# Patient Record
Sex: Male | Born: 1985 | Race: White | Hispanic: No | Marital: Single | Smoking: Current every day smoker
Health system: Southern US, Community
[De-identification: ages and names within clinical notes are randomized; demographics above are authoritative.]

---

## 2005-08-05 ENCOUNTER — Emergency Department: Payer: Self-pay | Admitting: Emergency Medicine

## 2007-12-16 ENCOUNTER — Emergency Department: Payer: Self-pay | Admitting: Emergency Medicine

## 2008-10-23 ENCOUNTER — Inpatient Hospital Stay: Payer: Self-pay | Admitting: Psychiatry

## 2009-12-03 ENCOUNTER — Emergency Department: Payer: Self-pay | Admitting: Unknown Physician Specialty

## 2010-02-12 ENCOUNTER — Emergency Department: Payer: Self-pay | Admitting: Emergency Medicine

## 2010-04-21 IMAGING — CT CT HEAD WITHOUT CONTRAST
1 of 2 series · 15 of 30 positions shown, 19 images · non-contrast
Comparison: none

REASON FOR EXAM: altered mental status - found by police significantly
impaired
COMMENTS:

PROCEDURE:     CT  - CT HEAD WITHOUT CONTRAST  - October 22, 2008  [DATE]
RESULT:     Comparison:  None
TECHNIQUE: Multiple axial images from the foramen magnum to the vertex were
obtained without IV contrast.

[Series 7: soft tissue · axial · 0.46mm/px · z∈[-90,-15]mm · 15 of 19 slices shown, 19 images]
[im 2/19  brain]
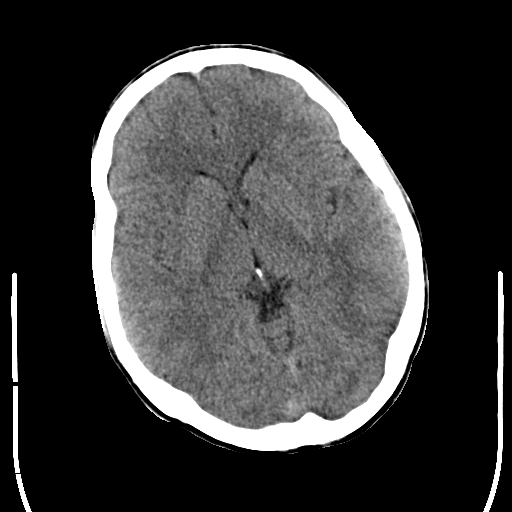
[im 2/19  bone]
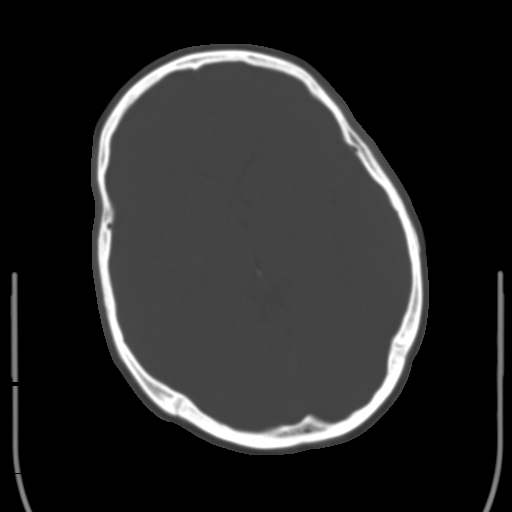
[im 3/19  brain]
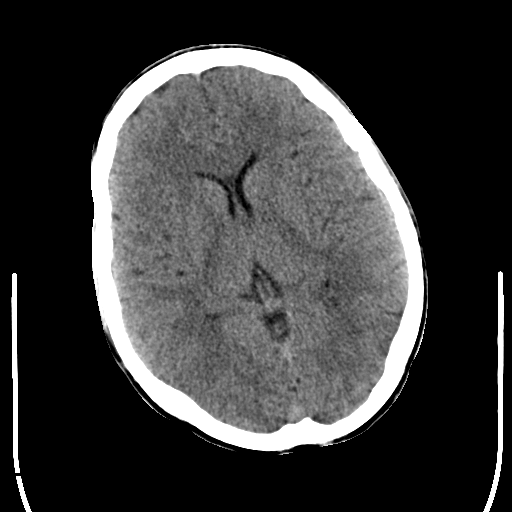
[im 4/19  brain]
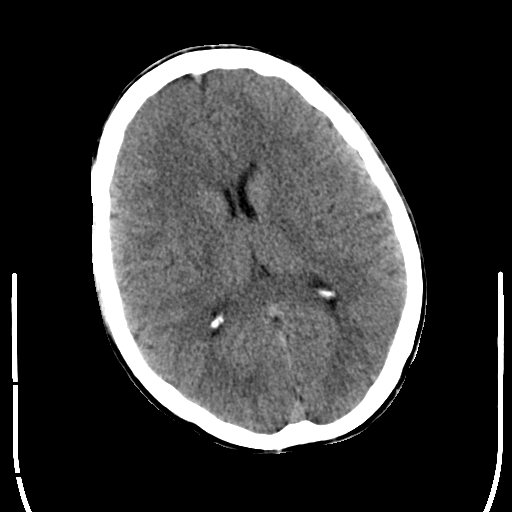
[im 5/19  brain]
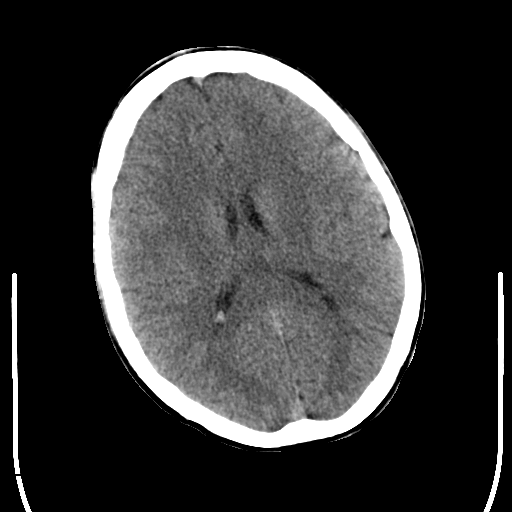
[im 6/19  brain]
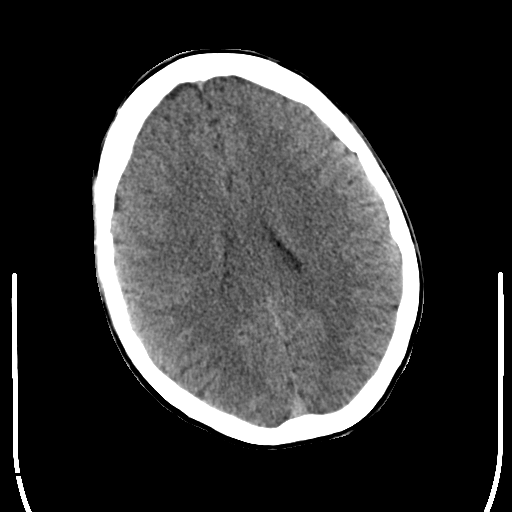
[im 6/19  bone]
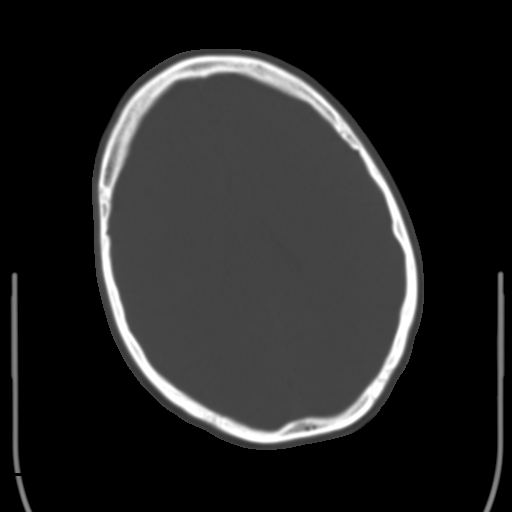
[im 7/19  brain]
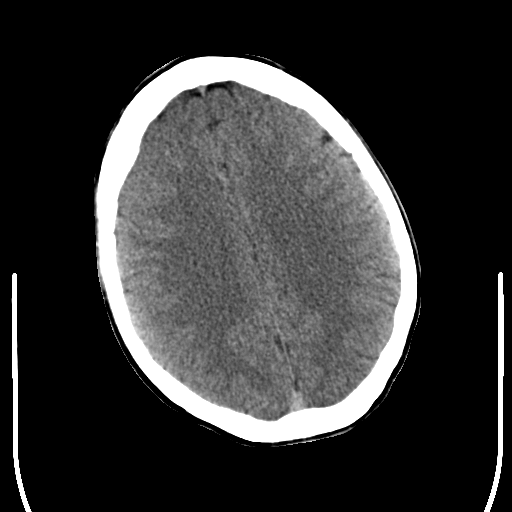
[im 8/19  brain]
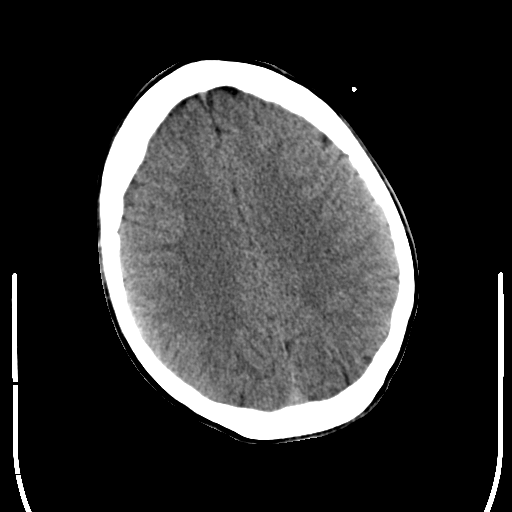
[im 10/19  brain]
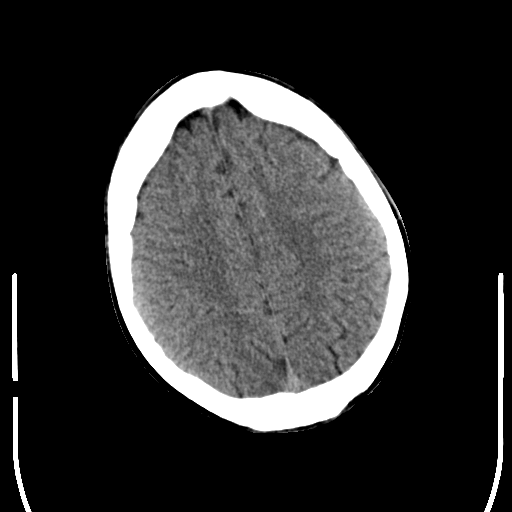
[im 11/19  brain]
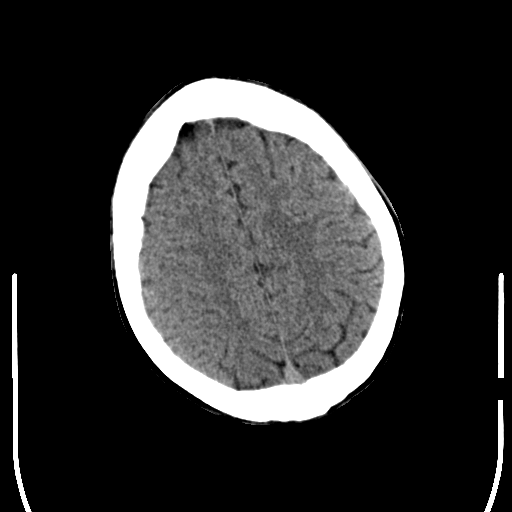
[im 11/19  bone]
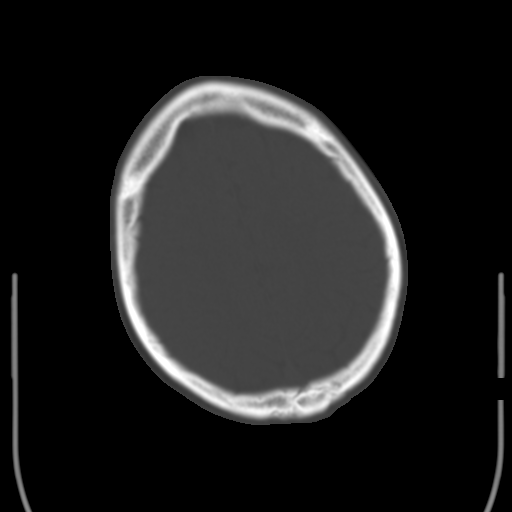
[im 12/19  brain]
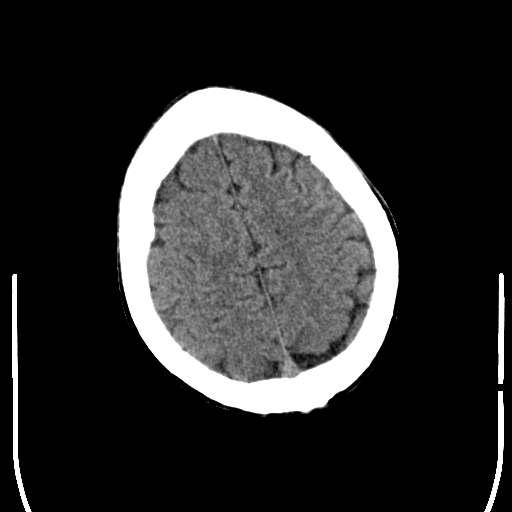
[im 13/19  brain]
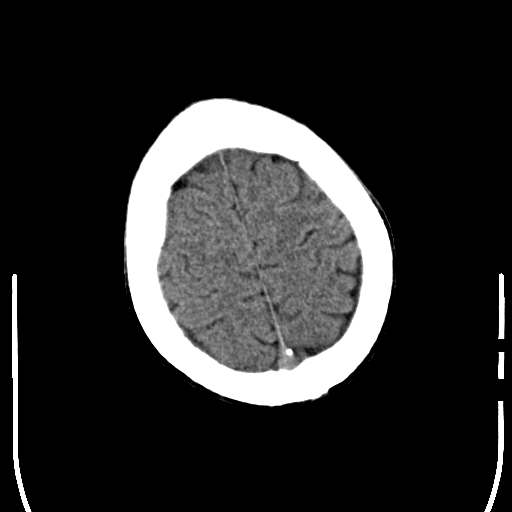
[im 14/19  brain]
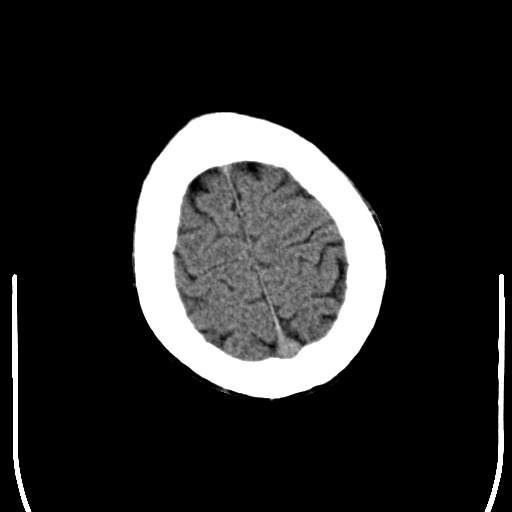
[im 15/19  brain]
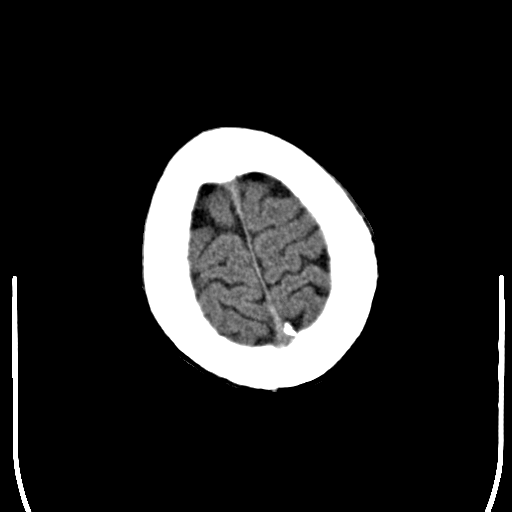
[im 15/19  bone]
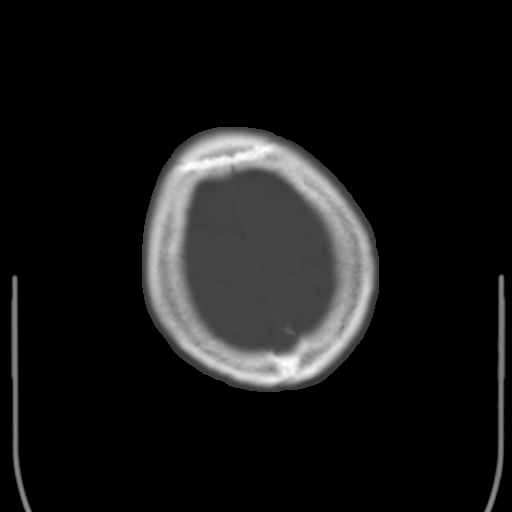
[im 16/19  brain]
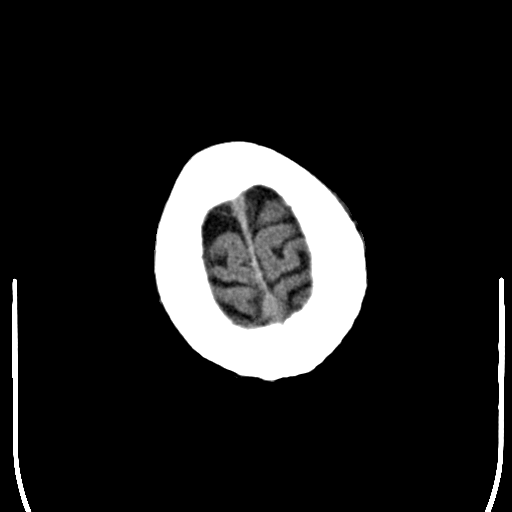
[im 17/19  brain]
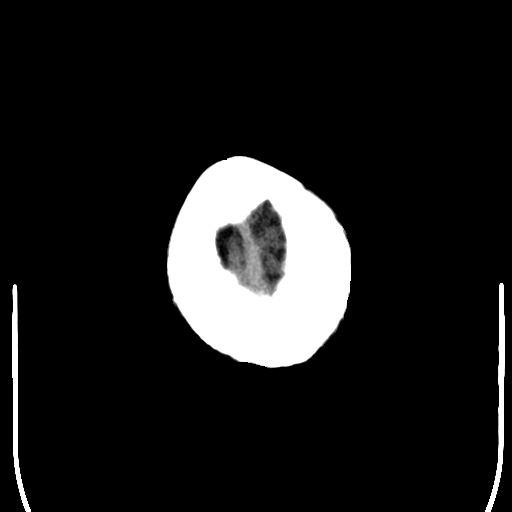

[15 of 30 positions shown; findings below may reference images not displayed]

FINDINGS: There is no evidence of mass effect, midline shift, or extra-axial fluid
collections.  There is no evidence of a space-occupying lesion or
intracranial hemorrhage. There is no evidence of a cortical-based area of
acute infarction.

The ventricles and sulci are appropriate for the patient's age. The basal
cisterns are patent.

Visualized portions of the orbits are unremarkable. The visualized portions
of the paranasal sinuses and mastoid air cells are unremarkable.

The osseous structures are unremarkable.
IMPRESSION: No acute intracranial process.

## 2010-05-31 ENCOUNTER — Emergency Department: Payer: Self-pay | Admitting: Emergency Medicine

## 2010-11-24 ENCOUNTER — Emergency Department: Payer: Self-pay | Admitting: Emergency Medicine

## 2011-06-02 IMAGING — CR RIGHT ANKLE - 2 VIEW
1 series · 2 of 2 positions shown · non-contrast
Comparison: none

REASON FOR EXAM: pain swelling
COMMENTS:

PROCEDURE:     DXR - DXR ANKLE RIGHT AP AND LATERAL  - December 03, 2009 [DATE]
RESULT:     No fracture, dislocation or other acute bony abnormality is
identified. The ankle mortise is well-maintained.

[Series 1: view not recorded · 0.17mm/px · 2 of 2 slices shown]
[im 1/2]
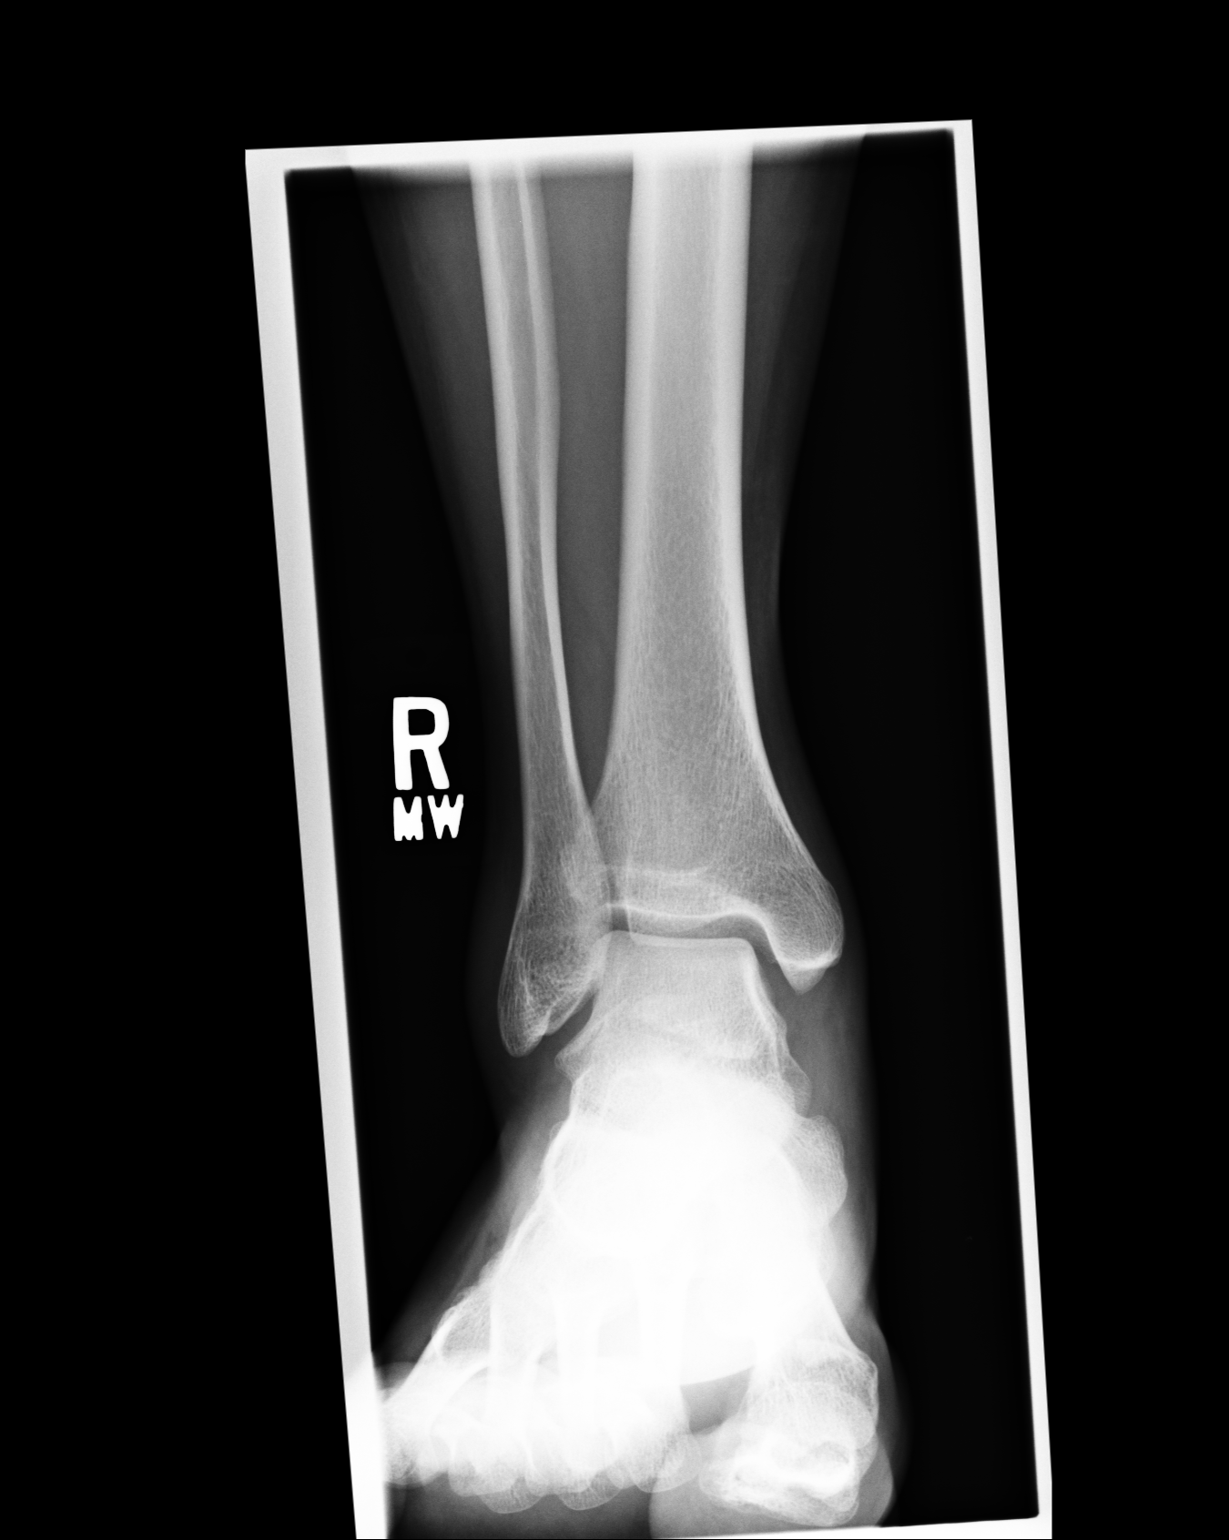
[im 2/2]
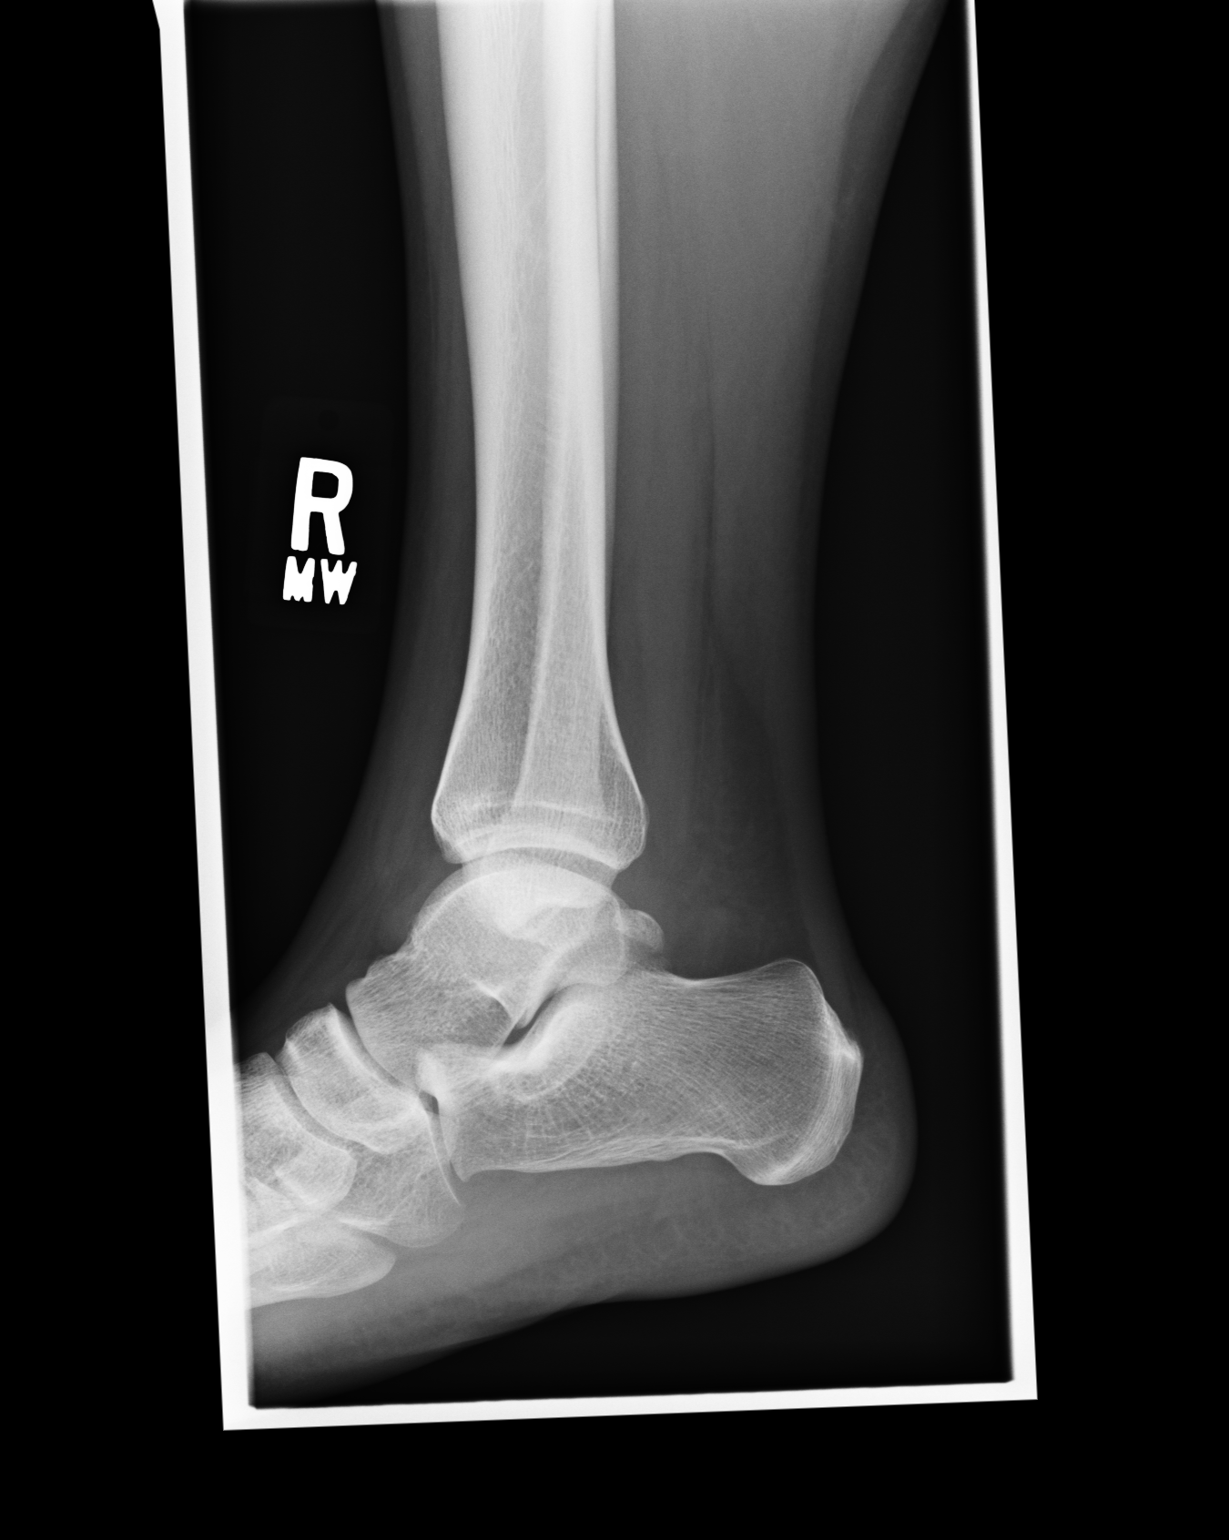

[2 of 2 positions shown; findings below may reference images not displayed]

IMPRESSION: 1.     No acute changes are identified.

## 2013-06-13 ENCOUNTER — Emergency Department: Payer: Self-pay | Admitting: Emergency Medicine

## 2013-06-13 LAB — COMPREHENSIVE METABOLIC PANEL
AST: 16 U/L (ref 15–37)
Albumin: 3.8 g/dL (ref 3.4–5.0)
Alkaline Phosphatase: 85 U/L
Anion Gap: 9 (ref 7–16)
BILIRUBIN TOTAL: 0.7 mg/dL (ref 0.2–1.0)
BUN: 11 mg/dL (ref 7–18)
Calcium, Total: 9 mg/dL (ref 8.5–10.1)
Chloride: 102 mmol/L (ref 98–107)
Co2: 26 mmol/L (ref 21–32)
Creatinine: 0.94 mg/dL (ref 0.60–1.30)
EGFR (African American): >60
EGFR (Non-African Amer.): >60
Glucose: 89 mg/dL (ref 65–99)
OSMOLALITY: 273 (ref 275–301)
Potassium: 3.3 mmol/L — ABNORMAL LOW (ref 3.5–5.1)
SGPT (ALT): 14 U/L (ref 12–78)
Sodium: 137 mmol/L (ref 136–145)
TOTAL PROTEIN: 7.4 g/dL (ref 6.4–8.2)

## 2013-06-13 LAB — CBC WITH DIFFERENTIAL/PLATELET
BASOS ABS: 0.1 10*3/uL (ref 0.0–0.1)
Basophil %: 0.6 %
Eosinophil #: 0.1 10*3/uL (ref 0.0–0.7)
Eosinophil %: 0.4 %
HCT: 42.6 % (ref 40.0–52.0)
HGB: 14.2 g/dL (ref 13.0–18.0)
Lymphocyte #: 1.8 10*3/uL (ref 1.0–3.6)
Lymphocyte %: 12.9 %
MCH: 30.3 pg (ref 26.0–34.0)
MCHC: 33.3 g/dL (ref 32.0–36.0)
MCV: 91 fL (ref 80–100)
Monocyte #: 0.9 x10 3/mm (ref 0.2–1.0)
Monocyte %: 6.6 %
NEUTROS ABS: 10.9 10*3/uL — AB (ref 1.4–6.5)
Neutrophil %: 79.5 %
PLATELETS: 244 10*3/uL (ref 150–440)
RBC: 4.68 10*6/uL (ref 4.40–5.90)
RDW: 12.9 % (ref 11.5–14.5)
WBC: 13.7 10*3/uL — ABNORMAL HIGH (ref 3.8–10.6)

## 2013-06-13 LAB — CK: CK, Total: 106 U/L

## 2013-06-15 LAB — BETA STREP CULTURE(ARMC)

## 2014-12-11 IMAGING — CR DG CHEST 2V
1 series · 2 of 2 positions shown · non-contrast
Comparison: 11/25/2010

CLINICAL DATA: Body aches.  Shortness of breath.

EXAM:
CHEST  2 VIEW

[Series 1: w chest pa · 0.14mm/px · 2 of 2 slices shown]
[im 1/2]
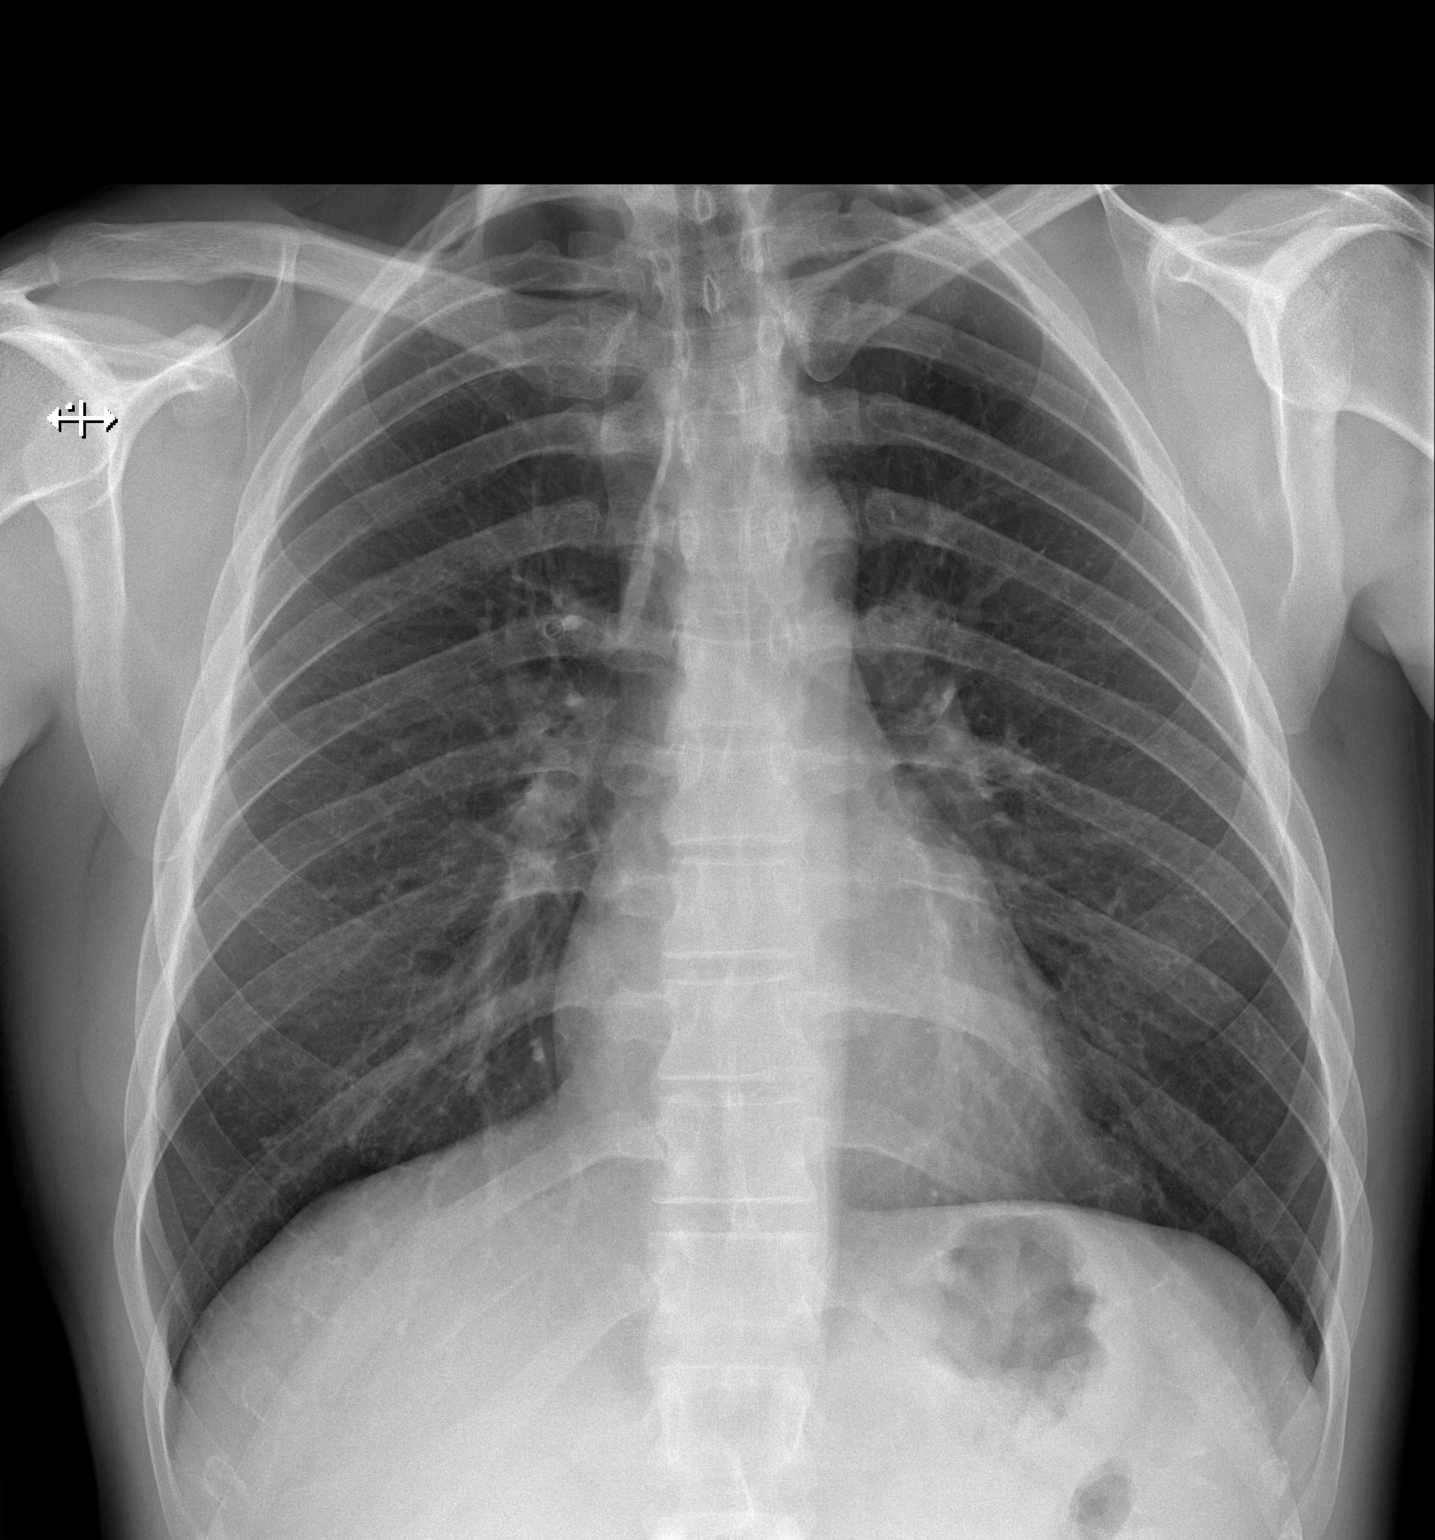
[im 2/2]
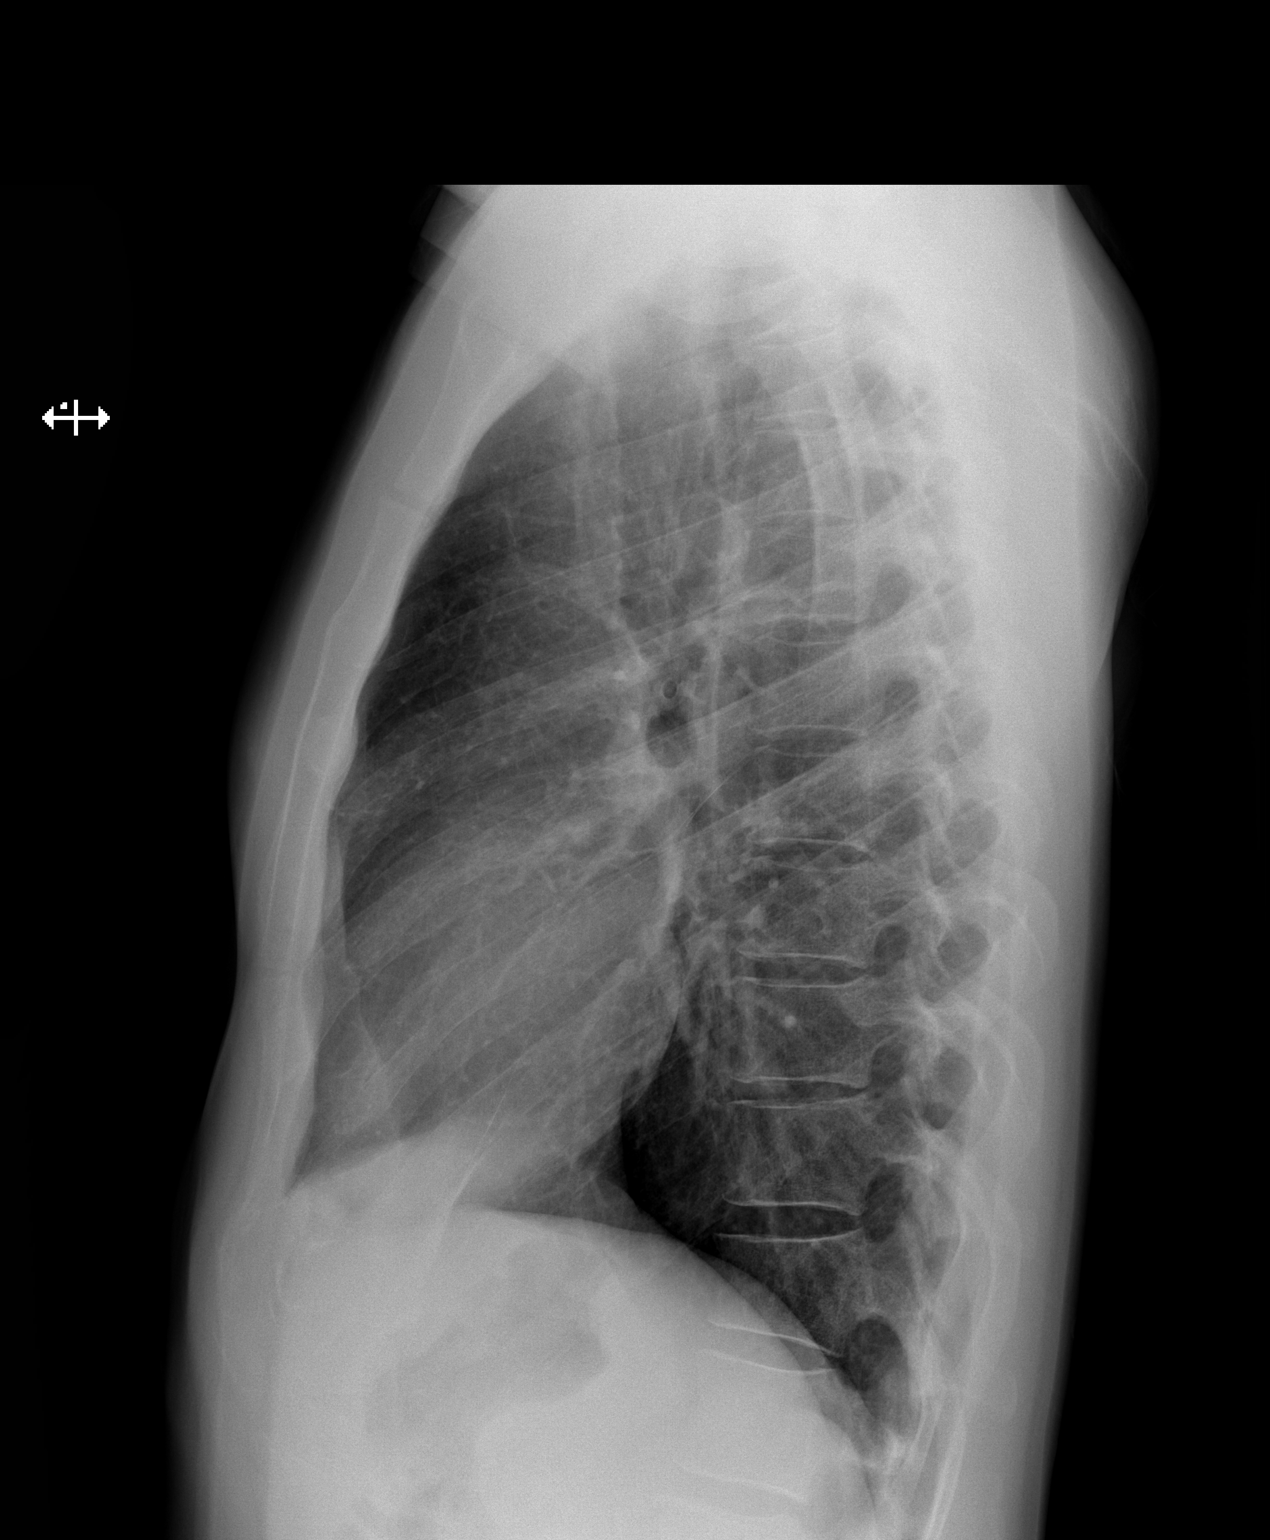

[2 of 2 positions shown; findings below may reference images not displayed]

FINDINGS: The heart size and mediastinal contours are within normal limits.
Both lungs are clear. The visualized skeletal structures are
unremarkable.
IMPRESSION: No active cardiopulmonary disease.

## 2015-10-19 ENCOUNTER — Emergency Department: Payer: Self-pay

## 2015-10-19 ENCOUNTER — Emergency Department
Admission: EM | Admit: 2015-10-19 | Discharge: 2015-10-19 | Disposition: A | Discharge status: Home / Self Care | Payer: Self-pay | Attending: Emergency Medicine | Admitting: Emergency Medicine

## 2015-10-19 DIAGNOSIS — M25511 Pain in right shoulder: Secondary | ICD-10-CM

## 2015-10-19 DIAGNOSIS — F17210 Nicotine dependence, cigarettes, uncomplicated: Secondary | ICD-10-CM | POA: Insufficient documentation

## 2015-10-19 DIAGNOSIS — M25512 Pain in left shoulder: Secondary | ICD-10-CM | POA: Insufficient documentation

## 2015-10-19 MED ORDER — TRAMADOL HCL 50 MG PO TABS: 50.0000 mg | ORAL_TABLET | Freq: Four times a day (QID) | ORAL | 0 refills | Status: AC | PRN

## 2015-10-19 MED ORDER — NAPROXEN 500 MG PO TABS: 500.0000 mg | ORAL_TABLET | Freq: Two times a day (BID) | ORAL | Status: AC

## 2015-10-19 NOTE — ED Notes (Signed)
States he developed pain to left shoulder about 3 days ago   Unsure of injury but was wrestling with friend  Did not feel a "pop"   No deformity noted

## 2015-10-19 NOTE — ED Triage Notes (Signed)
Pt c/o waking 2-3 days ago and had left shoulder pain, denies injury.

## 2015-10-19 NOTE — ED Provider Notes (Signed)
[facility] Emergency Department Provider Note   ____________________________________________   None    (approximate)  I have reviewed the triage vital signs and the nursing notes.   HISTORY  Chief Complaint Shoulder Pain    HPI Tanner Weeks is a 30 y.o. male patient complaining left shoulder pain secondary to wrestling incident 3 days ago. Patient was wrestling with friend. Patient did not notice any acute incident while wrestling. Patient's [facility] Patient state pain and discomfort increases with adduction and overhead reaching. Patient rates his pain as 8/10. Patient described a pain as "achy". No palliative measures for this complaint.   History reviewed. No pertinent past medical history.  There are no active problems to display for this patient.   History reviewed. No pertinent surgical history.  Prior to Admission medications   Medication Sig Start Date End Date Taking? Authorizing Provider  naproxen (NAPROSYN) 500 MG tablet Take 1 tablet (500 mg total) by mouth 2 (two) times daily with a meal. 10/19/15   Joni Reiningonald K Dorleen Kissel, PA-C  traMADol (ULTRAM) 50 MG tablet Take 1 tablet (50 mg total) by mouth every 6 (six) hours as needed for moderate pain. 10/19/15   Joni Reiningonald K Jamicheal Heard, PA-C    Allergies Review of patient's allergies indicates no known allergies.  No family history on file.  Social History Social History  Substance Use Topics  . Smoking status: Current Every Day Smoker    Types: Cigarettes  . Smokeless tobacco: Never Used  . Alcohol use No    Review of Systems Constitutional: No fever/chills Eyes: No visual changes. ENT: No sore throat. Cardiovascular: Denies chest pain. Respiratory: Denies shortness of breath. Gastrointestinal: No abdominal pain.  No nausea, no vomiting.  No diarrhea.  No constipation. Genitourinary: Negative for dysuria. Musculoskeletal:Left shoulder pain  Skin:  Negative for rash. Neurological: Negative for headaches, focal weakness or numbness.   ____________________________________________   PHYSICAL EXAM:  VITAL SIGNS: ED Triage Vitals  Enc Vitals Group     BP 10/19/15 0848 (!) 150/94     Pulse Rate 10/19/15 0848 69     Resp 10/19/15 0848 17     Temp 10/19/15 0848 98 F (36.7 C)     Temp Source 10/19/15 0848 Oral     SpO2 --      Weight 10/19/15 0848 157 lb (71.2 kg)     Height 10/19/15 0848 5\' 5"  (1.651 m)     Head Circumference --      Peak Flow --      Pain Score 10/19/15 0849 8     Pain Loc --      Pain Edu? --      Excl. in GC? --     Constitutional: Alert and oriented. Well appearing and in no acute distress. Eyes: Conjunctivae are normal. PERRL. EOMI. Head: Atraumatic. Nose: No congestion/rhinnorhea. Mouth/Throat: Mucous membranes are moist.  Oropharynx non-erythematous. Neck: No stridor.  No cervical spine tenderness to palpation. Hematological/Lymphatic/Immunilogical: No cervical lymphadenopathy. Cardiovascular: Normal rate, regular rhythm. Grossly normal heart sounds.  Good peripheral circulation. Respiratory: Normal respiratory effort.  No retractions. Lungs CTAB. Gastrointestinal: Soft and nontender. No distention. No adominal bruits. No CVA tenderness. Musculoskeletal: Patient is holding the arm in adduction. No obvious deformity. Patient has some moderate guarding palpation of the GH joint. Patient has decreased range of motion limited by complaining of pain.  Neurologic:  Normal speech and language. No gross focal neurologic deficits are  appreciated. No gait instability. Skin:  Skin is warm, dry and intact. No rash noted. Psychiatric: Mood and affect are normal. Speech and behavior are normal.  ____________________________________________   LABS (all labs ordered are listed, but only abnormal results are displayed)  Labs Reviewed - No data to  display ____________________________________________  EKG   ____________________________________________  RADIOLOGY  No acute findings on x-ray. ____________________________________________   PROCEDURES  Procedure(s) performed: None  Procedures  Critical Care performed: No  ____________________________________________   INITIAL IMPRESSION / ASSESSMENT AND PLAN / ED COURSE  Pertinent labs & imaging results that were available during my care of the patient were reviewed by me and considered in my medical decision making (see chart for details).  Left shoulder pain. Discussed negative x-ray finding with patient. Patient is placed in arm sling. Patient given a prescription for tramadol and naproxen. Patient advised to follow[facility] if condition persists.  Clinical Course     ____________________________________________   FINAL CLINICAL IMPRESSION(S) / ED DIAGNOSES  Final diagnoses:  Shoulder pain, right      NEW MEDICATIONS STARTED DURING THIS VISIT:  New Prescriptions   NAPROXEN (NAPROSYN) 500 MG TABLET    Take 1 tablet (500 mg total) by mouth 2 (two) times daily with a meal.   TRAMADOL (ULTRAM) 50 MG TABLET    Take 1 tablet (50 mg total) by mouth every 6 (six) hours as needed for moderate pain.     Note:  This document was prepared using Dragon voice recognition software and may include unintentional dictation errors.    Joni Reining, PA-C 10/19/15 1017    Minna Antis, MD 10/19/15 1525

## 2015-10-19 NOTE — Discharge Instructions (Signed)
Wear arm sling for 2-3 days as needed. °
